# Patient Record
Sex: Female | Born: 1970 | Race: Black or African American | Hispanic: No | Marital: Single | Smoking: Never smoker
Health system: Southern US, Community
[De-identification: ages and names within clinical notes are randomized; demographics above are authoritative.]

## PROBLEM LIST (undated history)

## (undated) DIAGNOSIS — E119 Type 2 diabetes mellitus without complications: Secondary | ICD-10-CM

## (undated) DIAGNOSIS — E785 Hyperlipidemia, unspecified: Secondary | ICD-10-CM

## (undated) HISTORY — PX: CERVICAL CERCLAGE: SHX1329

## (undated) HISTORY — DX: Type 2 diabetes mellitus without complications: E11.9

## (undated) HISTORY — PX: OTHER SURGICAL HISTORY: SHX169

## (undated) HISTORY — PX: TUBAL LIGATION: SHX77

## (undated) HISTORY — PX: CHOLECYSTECTOMY: SHX55

## (undated) HISTORY — DX: Hyperlipidemia, unspecified: E78.5

---

## 1998-10-29 ENCOUNTER — Other Ambulatory Visit: Admission: RE | Admit: 1998-10-29 | Discharge: 1998-10-29 | Payer: Self-pay | Admitting: Obstetrics and Gynecology

## 2000-01-07 ENCOUNTER — Other Ambulatory Visit: Admission: RE | Admit: 2000-01-07 | Discharge: 2000-01-07 | Payer: Self-pay | Admitting: Obstetrics and Gynecology

## 2000-10-30 ENCOUNTER — Encounter: Payer: Self-pay | Admitting: Obstetrics and Gynecology

## 2000-10-30 ENCOUNTER — Encounter: Admission: RE | Admit: 2000-10-30 | Discharge: 2000-10-30 | Payer: Self-pay | Admitting: Obstetrics and Gynecology

## 2001-05-06 ENCOUNTER — Encounter: Admission: RE | Admit: 2001-05-06 | Discharge: 2001-08-04 | Payer: Self-pay | Admitting: Family Medicine

## 2001-06-08 ENCOUNTER — Ambulatory Visit (HOSPITAL_COMMUNITY): Admission: RE | Admit: 2001-06-08 | Discharge: 2001-06-08 | Payer: Self-pay | Admitting: Obstetrics and Gynecology

## 2001-06-08 ENCOUNTER — Encounter: Payer: Self-pay | Admitting: Obstetrics and Gynecology

## 2001-09-02 ENCOUNTER — Other Ambulatory Visit: Admission: RE | Admit: 2001-09-02 | Discharge: 2001-09-02 | Payer: Self-pay | Admitting: *Deleted

## 2001-10-05 ENCOUNTER — Ambulatory Visit (HOSPITAL_COMMUNITY): Admission: RE | Admit: 2001-10-05 | Discharge: 2001-10-05 | Payer: Self-pay | Admitting: *Deleted

## 2002-05-25 ENCOUNTER — Emergency Department (HOSPITAL_COMMUNITY): Admission: EM | Admit: 2002-05-25 | Discharge: 2002-05-25 | Payer: Self-pay | Admitting: Emergency Medicine

## 2002-05-25 ENCOUNTER — Encounter: Payer: Self-pay | Admitting: Emergency Medicine

## 2002-05-25 ENCOUNTER — Ambulatory Visit (HOSPITAL_COMMUNITY): Admission: RE | Admit: 2002-05-25 | Discharge: 2002-05-25 | Payer: Self-pay | Admitting: Emergency Medicine

## 2002-07-05 ENCOUNTER — Other Ambulatory Visit: Admission: RE | Admit: 2002-07-05 | Discharge: 2002-07-05 | Payer: Self-pay | Admitting: Obstetrics and Gynecology

## 2002-10-04 ENCOUNTER — Inpatient Hospital Stay (HOSPITAL_COMMUNITY): Admission: AD | Admit: 2002-10-04 | Discharge: 2002-10-14 | Payer: Self-pay | Admitting: Obstetrics and Gynecology

## 2002-10-04 ENCOUNTER — Encounter: Payer: Self-pay | Admitting: Obstetrics and Gynecology

## 2002-10-07 ENCOUNTER — Encounter: Payer: Self-pay | Admitting: Obstetrics and Gynecology

## 2002-10-12 ENCOUNTER — Encounter: Payer: Self-pay | Admitting: Obstetrics and Gynecology

## 2002-10-15 ENCOUNTER — Inpatient Hospital Stay (HOSPITAL_COMMUNITY): Admission: AD | Admit: 2002-10-15 | Discharge: 2002-10-17 | Payer: Self-pay | Admitting: Obstetrics and Gynecology

## 2002-10-15 ENCOUNTER — Encounter: Payer: Self-pay | Admitting: *Deleted

## 2002-11-15 ENCOUNTER — Encounter: Payer: Self-pay | Admitting: *Deleted

## 2002-11-15 ENCOUNTER — Inpatient Hospital Stay (HOSPITAL_COMMUNITY): Admission: RE | Admit: 2002-11-15 | Discharge: 2002-11-15 | Payer: Self-pay | Admitting: *Deleted

## 2002-11-23 ENCOUNTER — Inpatient Hospital Stay (HOSPITAL_COMMUNITY): Admission: AD | Admit: 2002-11-23 | Discharge: 2002-12-06 | Payer: Self-pay | Admitting: *Deleted

## 2002-11-27 ENCOUNTER — Encounter: Payer: Self-pay | Admitting: Obstetrics and Gynecology

## 2002-12-04 ENCOUNTER — Encounter (INDEPENDENT_AMBULATORY_CARE_PROVIDER_SITE_OTHER): Payer: Self-pay | Admitting: *Deleted

## 2002-12-07 ENCOUNTER — Encounter: Admission: RE | Admit: 2002-12-07 | Discharge: 2003-01-06 | Payer: Self-pay | Admitting: *Deleted

## 2003-01-10 ENCOUNTER — Encounter: Admission: RE | Admit: 2003-01-10 | Discharge: 2003-01-10 | Payer: Self-pay | Admitting: *Deleted

## 2004-01-25 ENCOUNTER — Other Ambulatory Visit: Admission: RE | Admit: 2004-01-25 | Discharge: 2004-01-25 | Payer: Self-pay | Admitting: *Deleted

## 2004-08-29 ENCOUNTER — Encounter: Admission: RE | Admit: 2004-08-29 | Discharge: 2004-09-17 | Payer: Self-pay | Admitting: Obstetrics & Gynecology

## 2004-09-17 ENCOUNTER — Ambulatory Visit: Payer: Self-pay | Admitting: *Deleted

## 2004-09-24 ENCOUNTER — Ambulatory Visit: Payer: Self-pay | Admitting: *Deleted

## 2004-09-24 ENCOUNTER — Observation Stay (HOSPITAL_COMMUNITY): Admission: RE | Admit: 2004-09-24 | Discharge: 2004-09-25 | Payer: Self-pay | Admitting: *Deleted

## 2004-10-02 ENCOUNTER — Ambulatory Visit: Payer: Self-pay | Admitting: *Deleted

## 2004-10-09 ENCOUNTER — Ambulatory Visit: Payer: Self-pay | Admitting: *Deleted

## 2004-10-16 ENCOUNTER — Ambulatory Visit: Payer: Self-pay | Admitting: *Deleted

## 2004-10-16 ENCOUNTER — Ambulatory Visit (HOSPITAL_COMMUNITY): Admission: RE | Admit: 2004-10-16 | Discharge: 2004-10-16 | Payer: Self-pay | Admitting: *Deleted

## 2004-10-23 ENCOUNTER — Ambulatory Visit: Payer: Self-pay | Admitting: Family Medicine

## 2004-10-25 ENCOUNTER — Ambulatory Visit: Payer: Self-pay | Admitting: Obstetrics & Gynecology

## 2004-10-31 ENCOUNTER — Ambulatory Visit: Payer: Self-pay | Admitting: Family Medicine

## 2004-11-06 ENCOUNTER — Ambulatory Visit: Payer: Self-pay | Admitting: *Deleted

## 2004-11-12 ENCOUNTER — Ambulatory Visit (HOSPITAL_COMMUNITY): Admission: RE | Admit: 2004-11-12 | Discharge: 2004-11-12 | Payer: Self-pay | Admitting: *Deleted

## 2004-11-13 ENCOUNTER — Ambulatory Visit: Payer: Self-pay | Admitting: *Deleted

## 2004-11-20 ENCOUNTER — Ambulatory Visit: Payer: Self-pay | Admitting: *Deleted

## 2004-11-24 ENCOUNTER — Inpatient Hospital Stay (HOSPITAL_COMMUNITY): Admission: AD | Admit: 2004-11-24 | Discharge: 2004-11-29 | Payer: Self-pay | Admitting: Obstetrics & Gynecology

## 2004-11-26 ENCOUNTER — Ambulatory Visit: Payer: Self-pay | Admitting: *Deleted

## 2004-12-04 ENCOUNTER — Ambulatory Visit: Payer: Self-pay | Admitting: *Deleted

## 2004-12-04 ENCOUNTER — Ambulatory Visit (HOSPITAL_COMMUNITY): Admission: RE | Admit: 2004-12-04 | Discharge: 2004-12-04 | Payer: Self-pay | Admitting: *Deleted

## 2004-12-10 ENCOUNTER — Inpatient Hospital Stay (HOSPITAL_COMMUNITY): Admission: AD | Admit: 2004-12-10 | Discharge: 2004-12-10 | Payer: Self-pay | Admitting: *Deleted

## 2004-12-18 ENCOUNTER — Ambulatory Visit: Payer: Self-pay | Admitting: *Deleted

## 2004-12-25 ENCOUNTER — Ambulatory Visit: Payer: Self-pay | Admitting: *Deleted

## 2004-12-25 ENCOUNTER — Ambulatory Visit (HOSPITAL_COMMUNITY): Admission: RE | Admit: 2004-12-25 | Discharge: 2004-12-25 | Payer: Self-pay | Admitting: *Deleted

## 2005-01-01 ENCOUNTER — Ambulatory Visit: Payer: Self-pay | Admitting: *Deleted

## 2005-01-09 ENCOUNTER — Ambulatory Visit: Payer: Self-pay | Admitting: Family Medicine

## 2005-01-15 ENCOUNTER — Ambulatory Visit: Payer: Self-pay | Admitting: *Deleted

## 2005-01-22 ENCOUNTER — Ambulatory Visit (HOSPITAL_COMMUNITY): Admission: RE | Admit: 2005-01-22 | Discharge: 2005-01-22 | Payer: Self-pay | Admitting: *Deleted

## 2005-01-22 ENCOUNTER — Ambulatory Visit: Payer: Self-pay | Admitting: *Deleted

## 2005-01-29 ENCOUNTER — Ambulatory Visit: Payer: Self-pay | Admitting: *Deleted

## 2005-02-05 ENCOUNTER — Ambulatory Visit: Payer: Self-pay | Admitting: *Deleted

## 2005-02-10 ENCOUNTER — Ambulatory Visit: Payer: Self-pay | Admitting: *Deleted

## 2005-02-10 IMAGING — US US OB TRANSVAGINAL
1 series · 14 of 15 positions shown · non-contrast
Comparison: none

CLINICAL DATA: Question change in cervical length.  Cerclage.
 TRANSVAGINAL OBSTETRICAL ULTRASOUND:
 Transvaginal images only are performed, showing cervical length of 3.3 cm.  Cerclage is visualized.  Note is made of small amount of funneling of the internal os with internal os dilatation of 8 mm.  
 Fetus is in breech presentation and fetal movement was noted, though heart rate could not be obtained from transvaginal approach.

[Series 1: us ob transvaginal · 0.19mm/px · 14 of 15 slices shown]
[im 1/15]
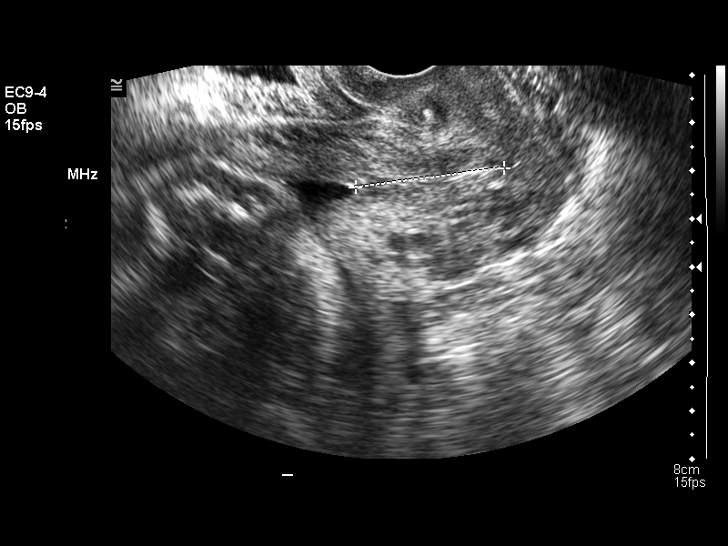
[im 2/15]
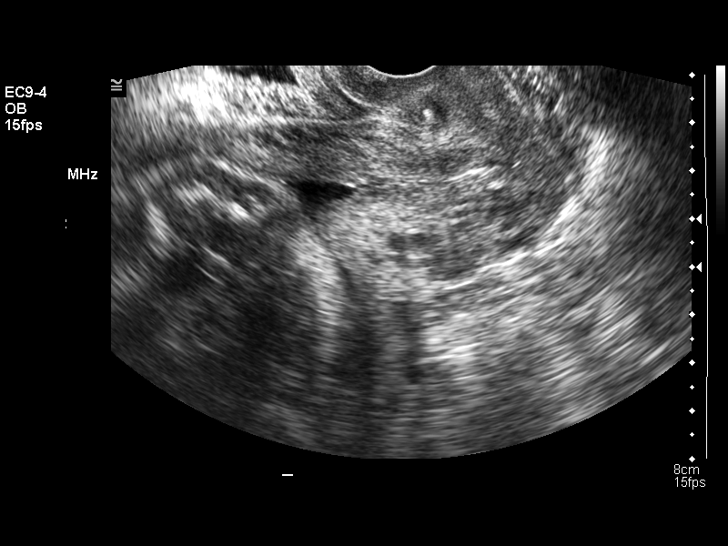
[im 3/15]
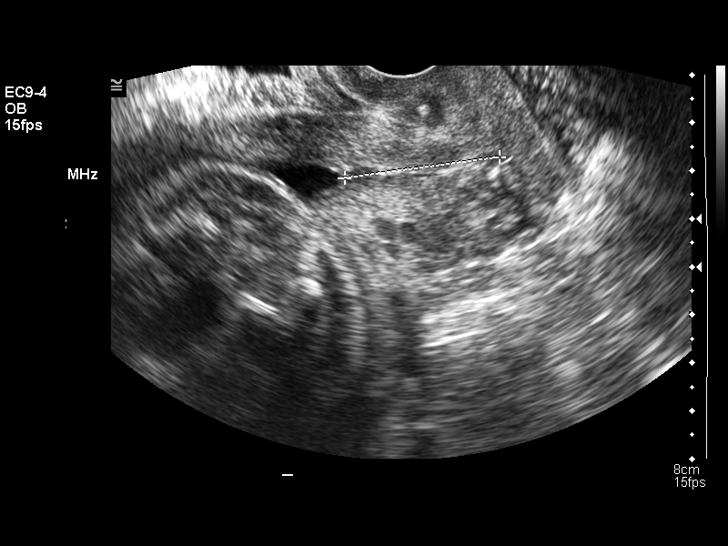
[im 4/15]
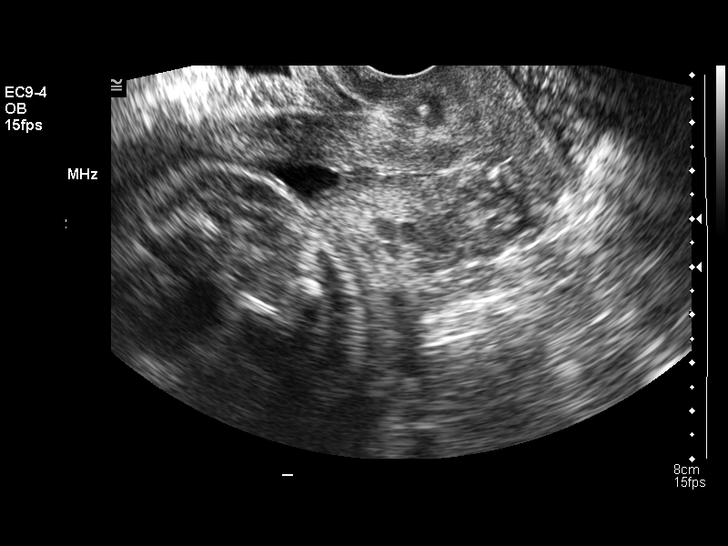
[im 5/15]
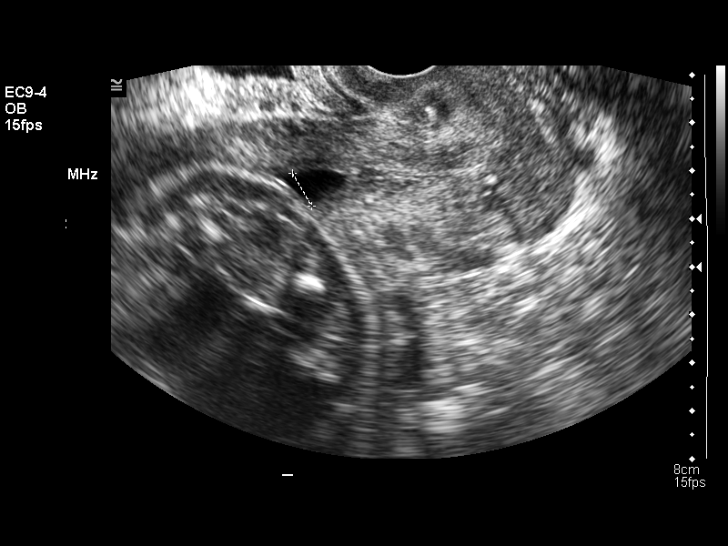
[im 6/15]
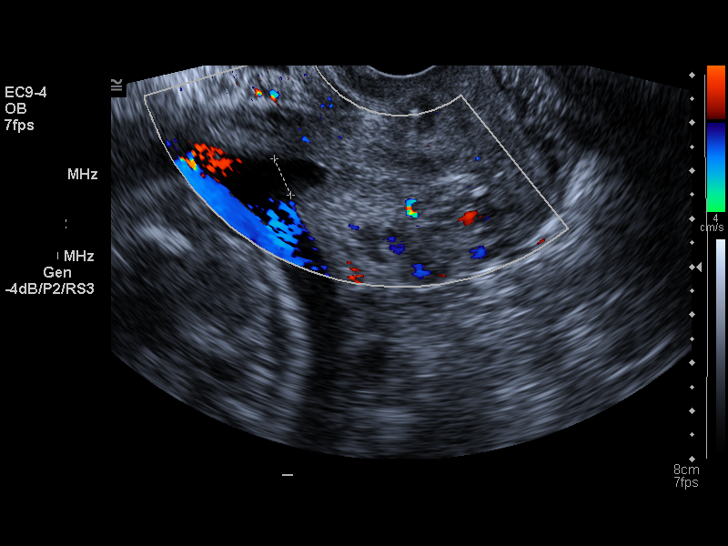
[im 7/15]
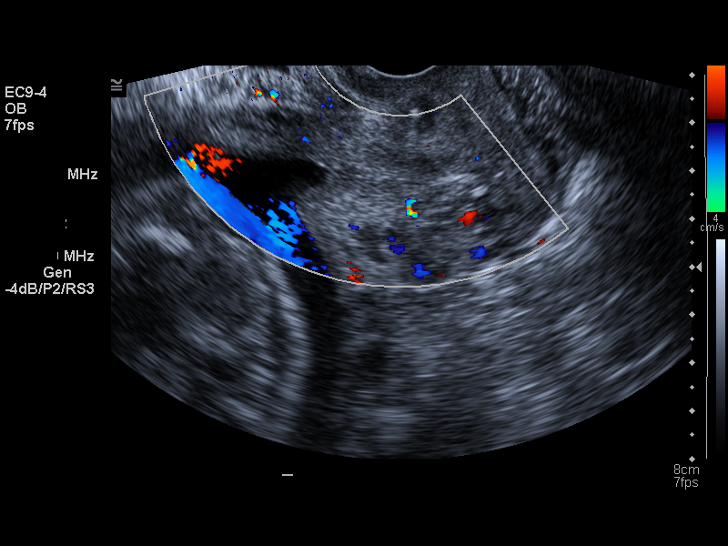
[im 9/15]
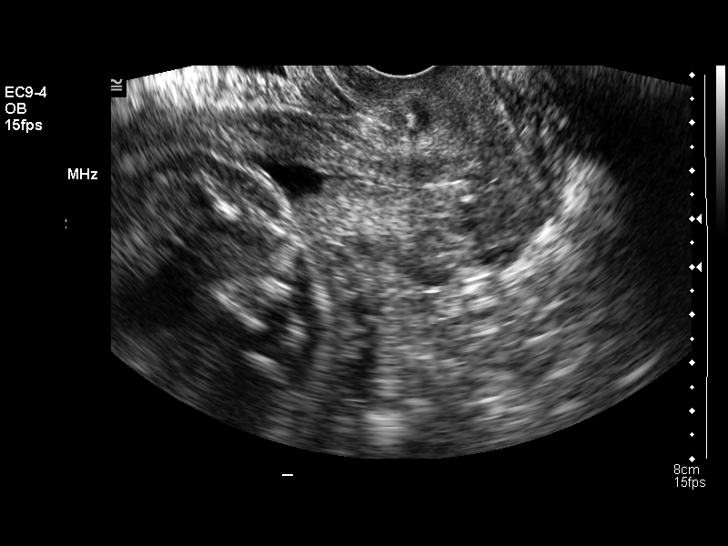
[im 10/15]
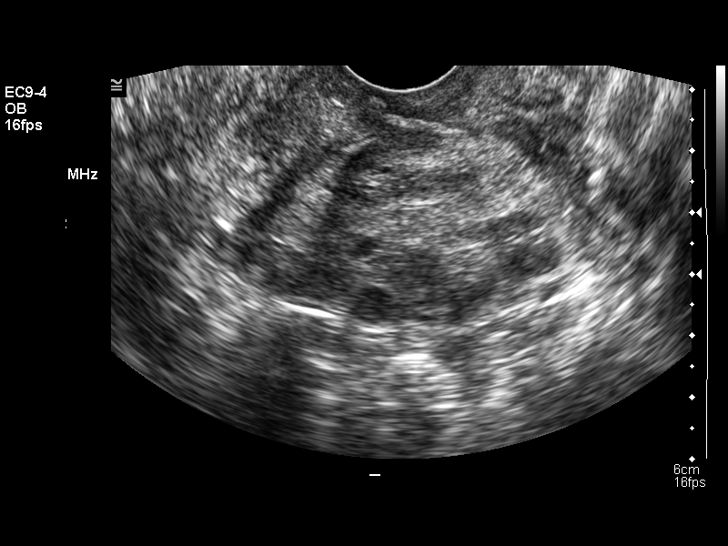
[im 11/15]
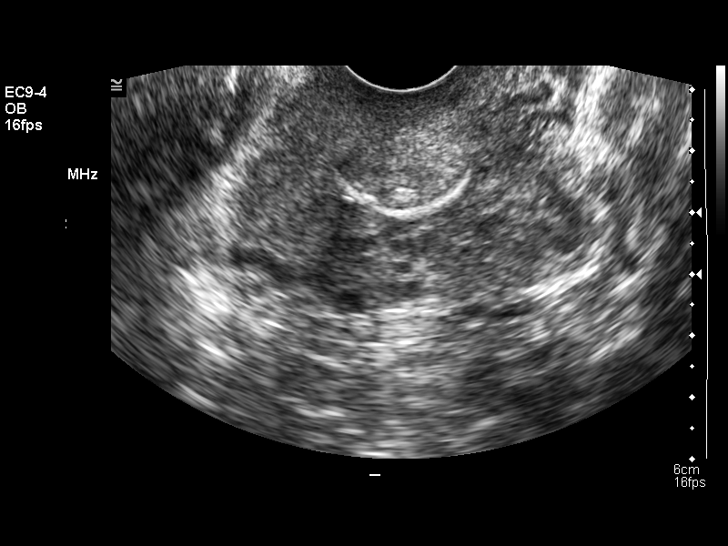
[im 12/15]
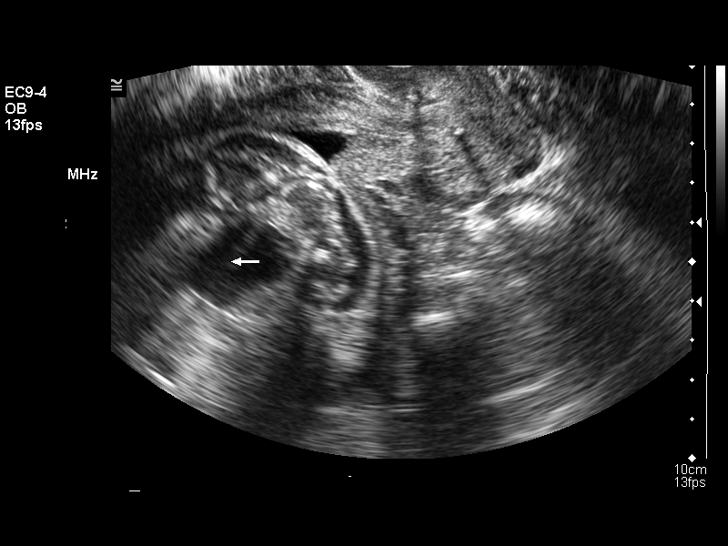
[im 13/15]
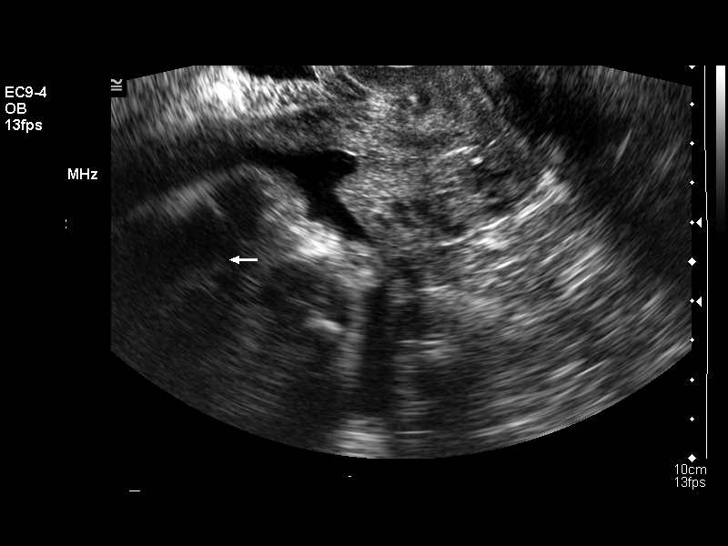
[im 14/15]
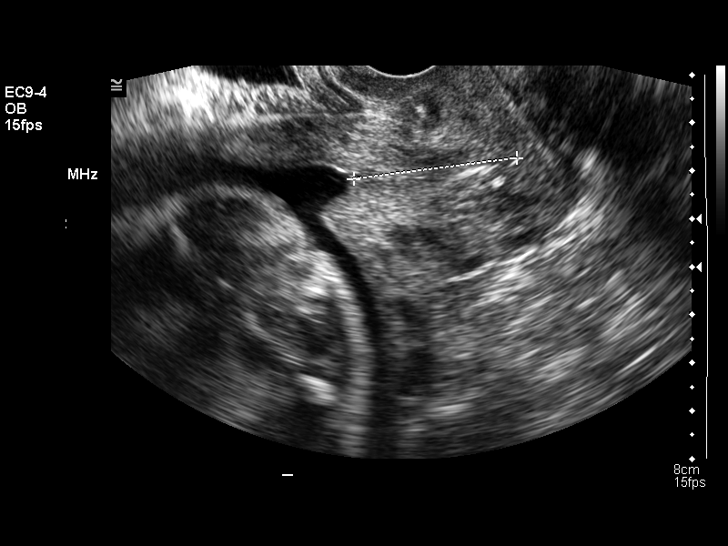
[im 15/15]
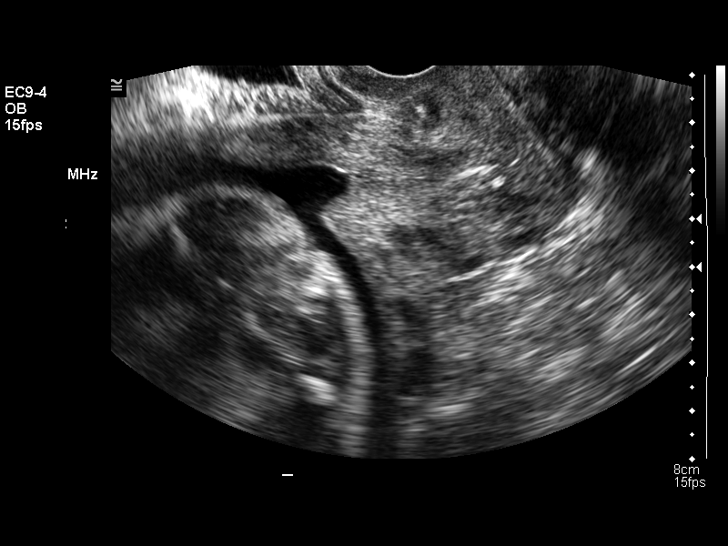

[14 of 15 positions shown; findings below may reference images not displayed]

IMPRESSION: 1.  Cervical length of 3.3 cm.
 2.  Minimal funneling of internal os.

## 2005-02-12 ENCOUNTER — Ambulatory Visit: Payer: Self-pay | Admitting: *Deleted

## 2005-02-17 ENCOUNTER — Ambulatory Visit: Payer: Self-pay | Admitting: Obstetrics & Gynecology

## 2005-02-19 ENCOUNTER — Ambulatory Visit: Payer: Self-pay | Admitting: *Deleted

## 2005-02-19 ENCOUNTER — Ambulatory Visit (HOSPITAL_COMMUNITY): Admission: RE | Admit: 2005-02-19 | Discharge: 2005-02-19 | Payer: Self-pay | Admitting: *Deleted

## 2005-02-24 ENCOUNTER — Ambulatory Visit: Payer: Self-pay | Admitting: Obstetrics & Gynecology

## 2005-02-26 ENCOUNTER — Ambulatory Visit: Payer: Self-pay | Admitting: *Deleted

## 2005-02-26 ENCOUNTER — Ambulatory Visit (HOSPITAL_COMMUNITY): Admission: RE | Admit: 2005-02-26 | Discharge: 2005-02-26 | Payer: Self-pay | Admitting: *Deleted

## 2005-02-27 ENCOUNTER — Inpatient Hospital Stay (HOSPITAL_COMMUNITY): Admission: AD | Admit: 2005-02-27 | Discharge: 2005-02-27 | Payer: Self-pay | Admitting: Obstetrics & Gynecology

## 2005-03-01 ENCOUNTER — Inpatient Hospital Stay (HOSPITAL_COMMUNITY): Admission: AD | Admit: 2005-03-01 | Discharge: 2005-03-04 | Payer: Self-pay | Admitting: *Deleted

## 2005-03-01 ENCOUNTER — Ambulatory Visit: Payer: Self-pay | Admitting: *Deleted

## 2005-03-01 ENCOUNTER — Encounter (INDEPENDENT_AMBULATORY_CARE_PROVIDER_SITE_OTHER): Payer: Self-pay | Admitting: Specialist

## 2005-03-06 ENCOUNTER — Inpatient Hospital Stay (HOSPITAL_COMMUNITY): Admission: AD | Admit: 2005-03-06 | Discharge: 2005-03-06 | Payer: Self-pay | Admitting: Obstetrics & Gynecology

## 2005-03-07 ENCOUNTER — Encounter: Payer: Self-pay | Admitting: Emergency Medicine

## 2005-03-08 ENCOUNTER — Inpatient Hospital Stay (HOSPITAL_COMMUNITY): Admission: AD | Admit: 2005-03-08 | Discharge: 2005-03-08 | Payer: Self-pay | Admitting: Obstetrics and Gynecology

## 2005-03-08 IMAGING — US US FETAL BPP W/O NONSTRESS
1 series · 13 of 28 positions shown · non-contrast
Comparison: none

CLINICAL DATA: Assess growth and fetal well-being.  Insulin dependent diabetes.

[Series 1: us fetal bpp w/o nonstress · 0.37mm/px · 36 acquisitions, 13 frames shown]
[im 2/36]
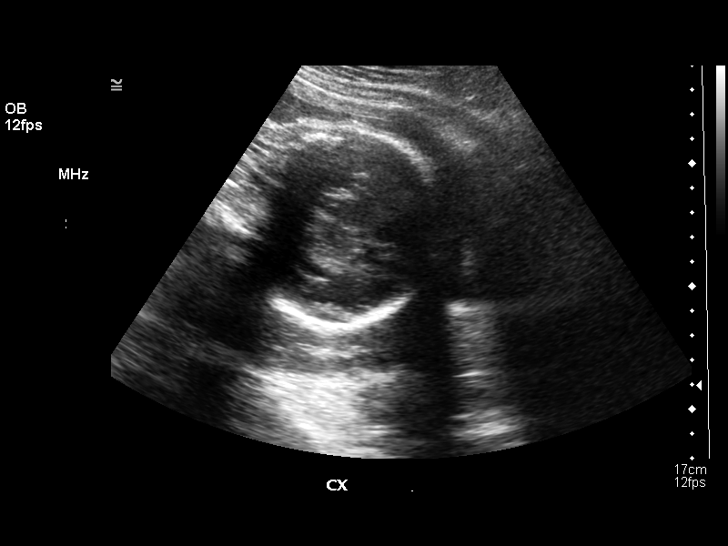
[im 4/36]
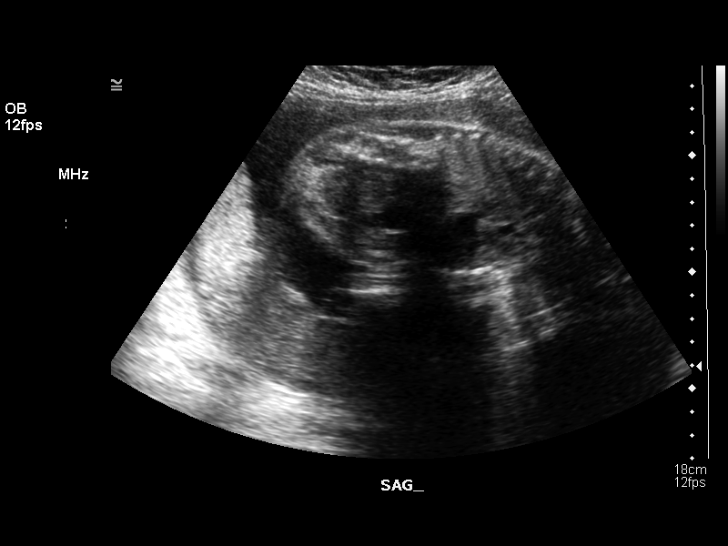
[im 7/36]
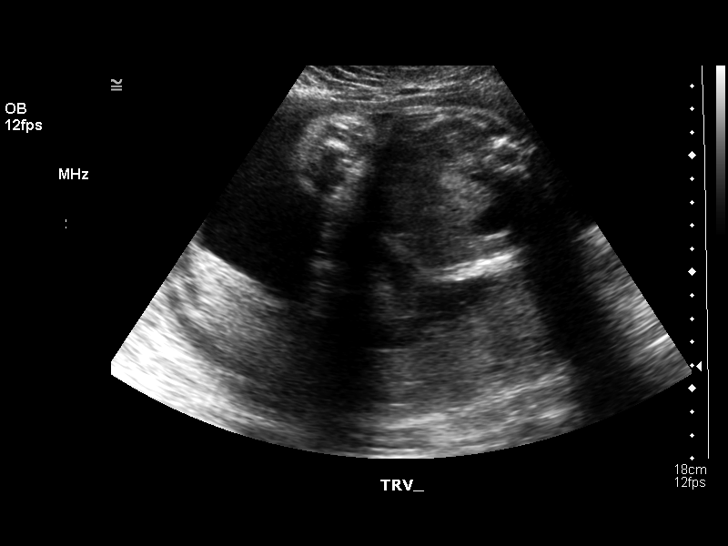
[im 10/36]
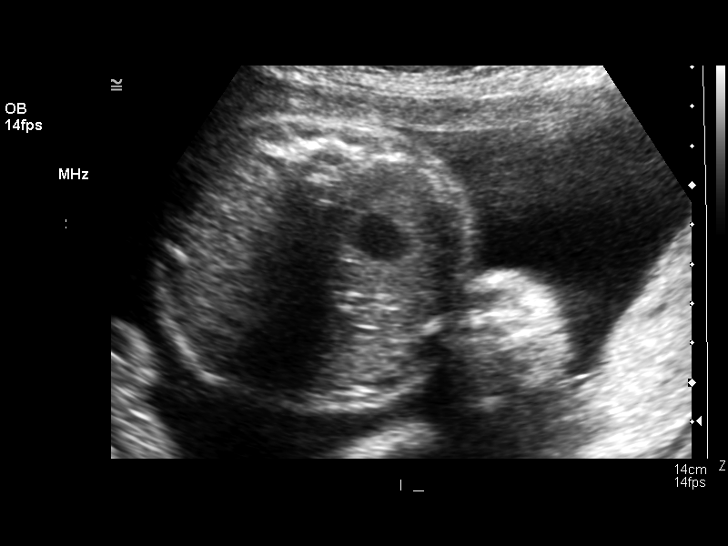
[im 12/36]
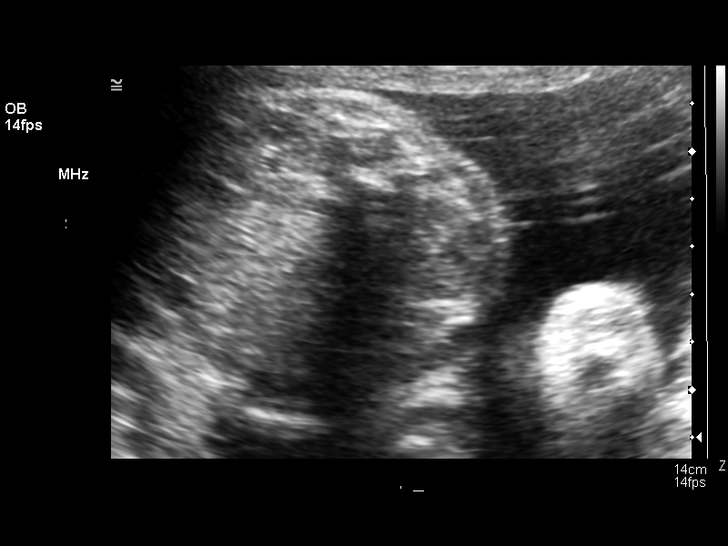
[im 15/36]
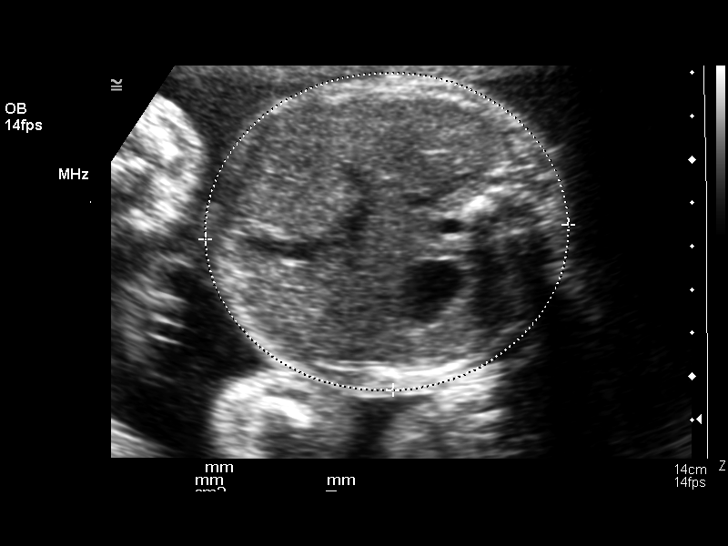
[im 19/36]
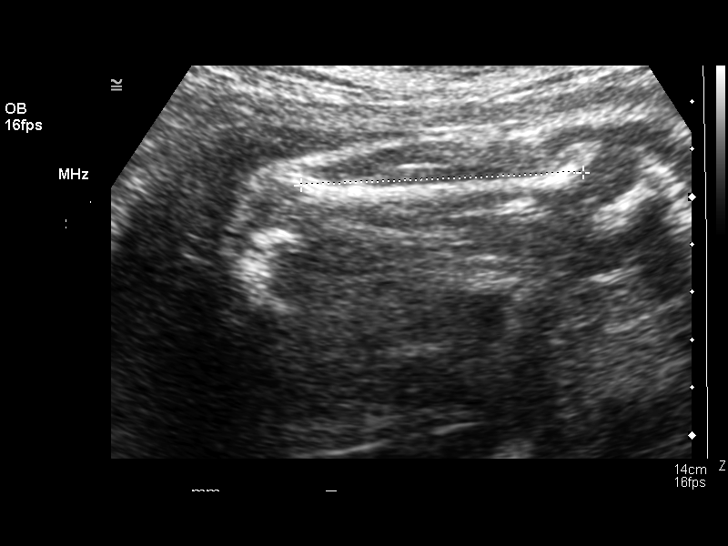
[im 21/36]
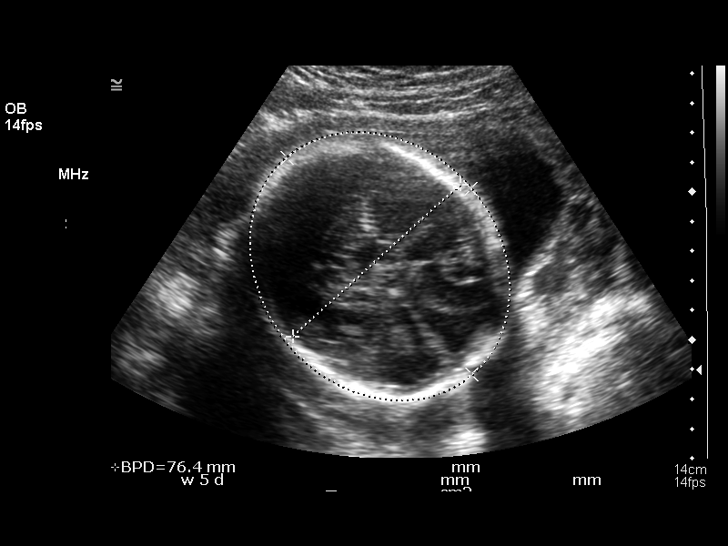
[im 24/36]
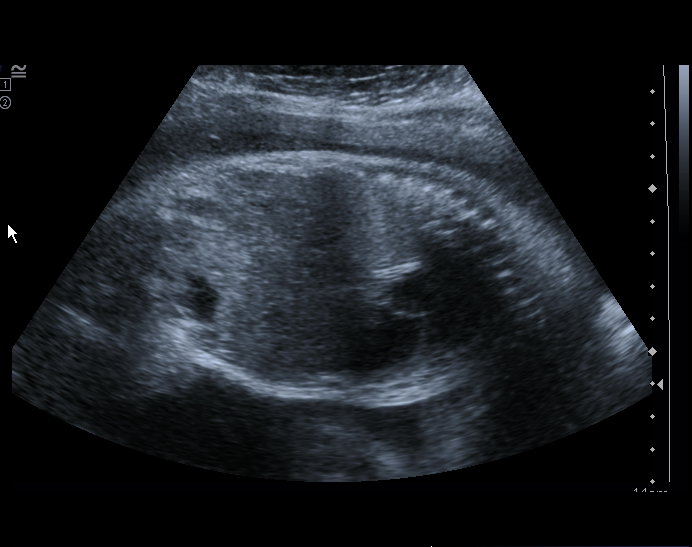
[im 26/36]
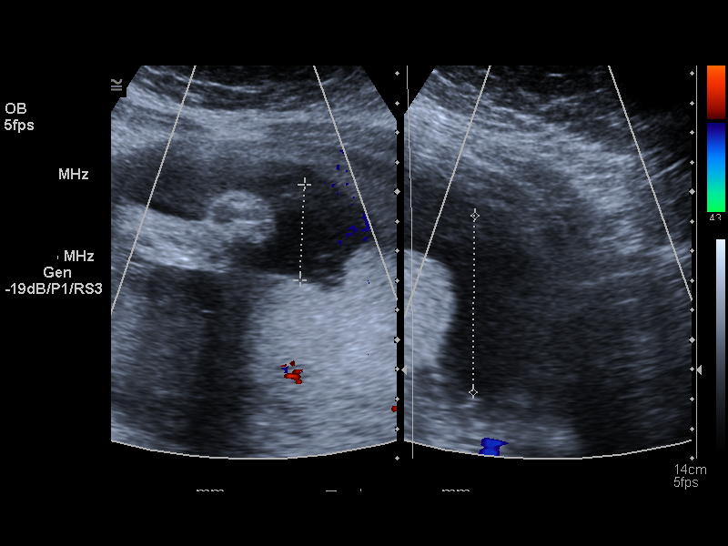
[im 29/36]
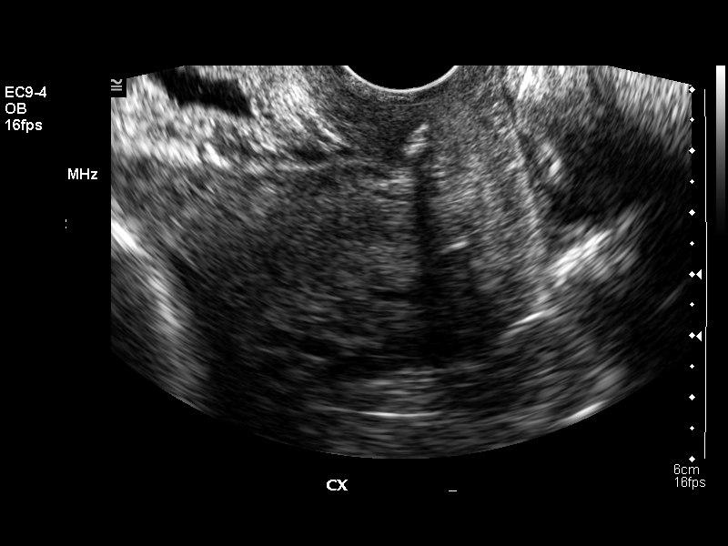
[im 32/36]
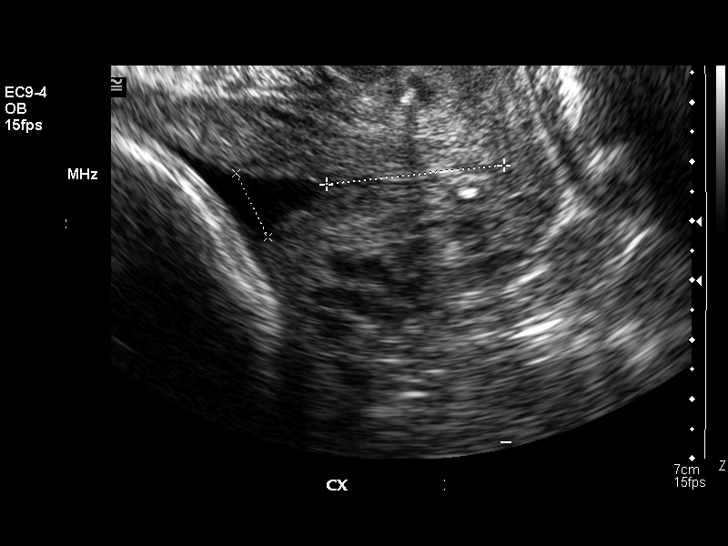
[im 34/36]
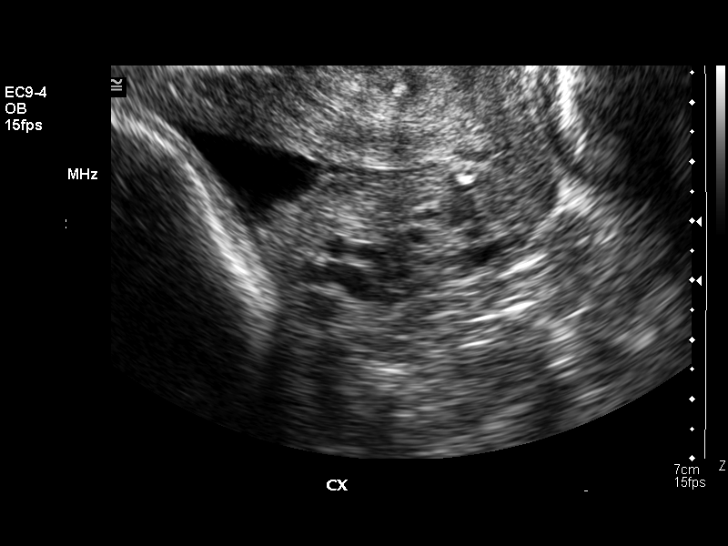

[13 of 28 positions shown; findings below may reference images not displayed]

OBSTETRICAL ULTRASOUND RE-EVALUATION WITH TRANSVAGINAL:
Number of Fetuses:  1
Heart Rate:  144
Movement:  Yes
Breathing:  Yes
Presentation:  Cephalic
Placental Location:  Posterior
Grade:  I
Previa:  No
Amniotic Fluid (subjective):  Normal
Amniotic Fluid (objective):  18.3 cm AFI (5th -95th%ile =   9.2 ? 23.1 cm for 29 wks)

FETAL BIOMETRY
BPD:  7.7 cm   30 w 5 d
HC:  27.9 cm   30 w 4 d
AC:  25.0 cm   29 w 2 d
FL:  5.9 cm   30 w 5 d

Mean GA:  30 w 2 d
Assigned GA:  28 w 6 d
Fetal indices are within normal limits. 
EFW: 6616 g (H) 75th ? 90th%ile (8975 ? 9904 g) For 29 wks

FETAL ANATOMY
Lateral Ventricles:  Previously seen 
Thalami/CSP:  Previously seen 
Posterior Fossa:  Previously seen 
Nuchal Region:  Previously seen 
Spine:  Previously seen 
4 Chamber Heart on Left:  Not visualized 
Stomach on Left:  Visualized 
3 Vessel Cord:  Previously seen 
Cord Insertion Site:  Previously seen 
Kidneys:  Visualized 
Bladder:  Visualized 
Extremities:  Previously seen 

MATERNAL UTERINE AND ADNEXAL FINDINGS
Cervix:  3.3 cm Transvaginally
There is funneling noted at the level of the internal cervical os measuring 1.2 cm.  The patient?s cerclage is visualized and appears intact.  

BIOPHYSICAL PROFILE

Movement:   2    Time:  15 minutes
Breathing:  2
Tone:  2
Amniotic Fluid:  2

Total Score:  8
IMPRESSION: 1.  Single intrauterine pregnancy demonstrating an estimated gestational age by ultrasound of 30 weeks and 2 days.  This is 1 week and 3 days ahead of assigned gestational age by initial ultrasound of 28 weeks and 6 days.  Currently the estimated fetal weight is just below the 90th percentile for a 29 week gestation.  Close follow-up for growth is recommended.  
2.  Subjectively and quantitatively normal amniotic fluid volume and normal cervical length.  Noted is minimal funneling at the level of the internal cervical os.  
3.  Biophysical profile score [DATE]. 
4.  No late developing fetal anatomic abnormalities are identified associated with the stomach, kidneys or bladder.  The lateral ventricles and four chamber heart view could not be seen with confidence due to positioning on today?s exam.

## 2005-04-17 ENCOUNTER — Ambulatory Visit: Payer: Self-pay | Admitting: Obstetrics and Gynecology

## 2005-05-19 IMAGING — CR DG CHEST 2V
2 series · 2 of 2 positions shown · non-contrast
Comparison: none

CLINICAL DATA: chest pain   and shortness of breath     
COMPARISION: NONE  31IGP-3 VIEWS:

[w chest pa]
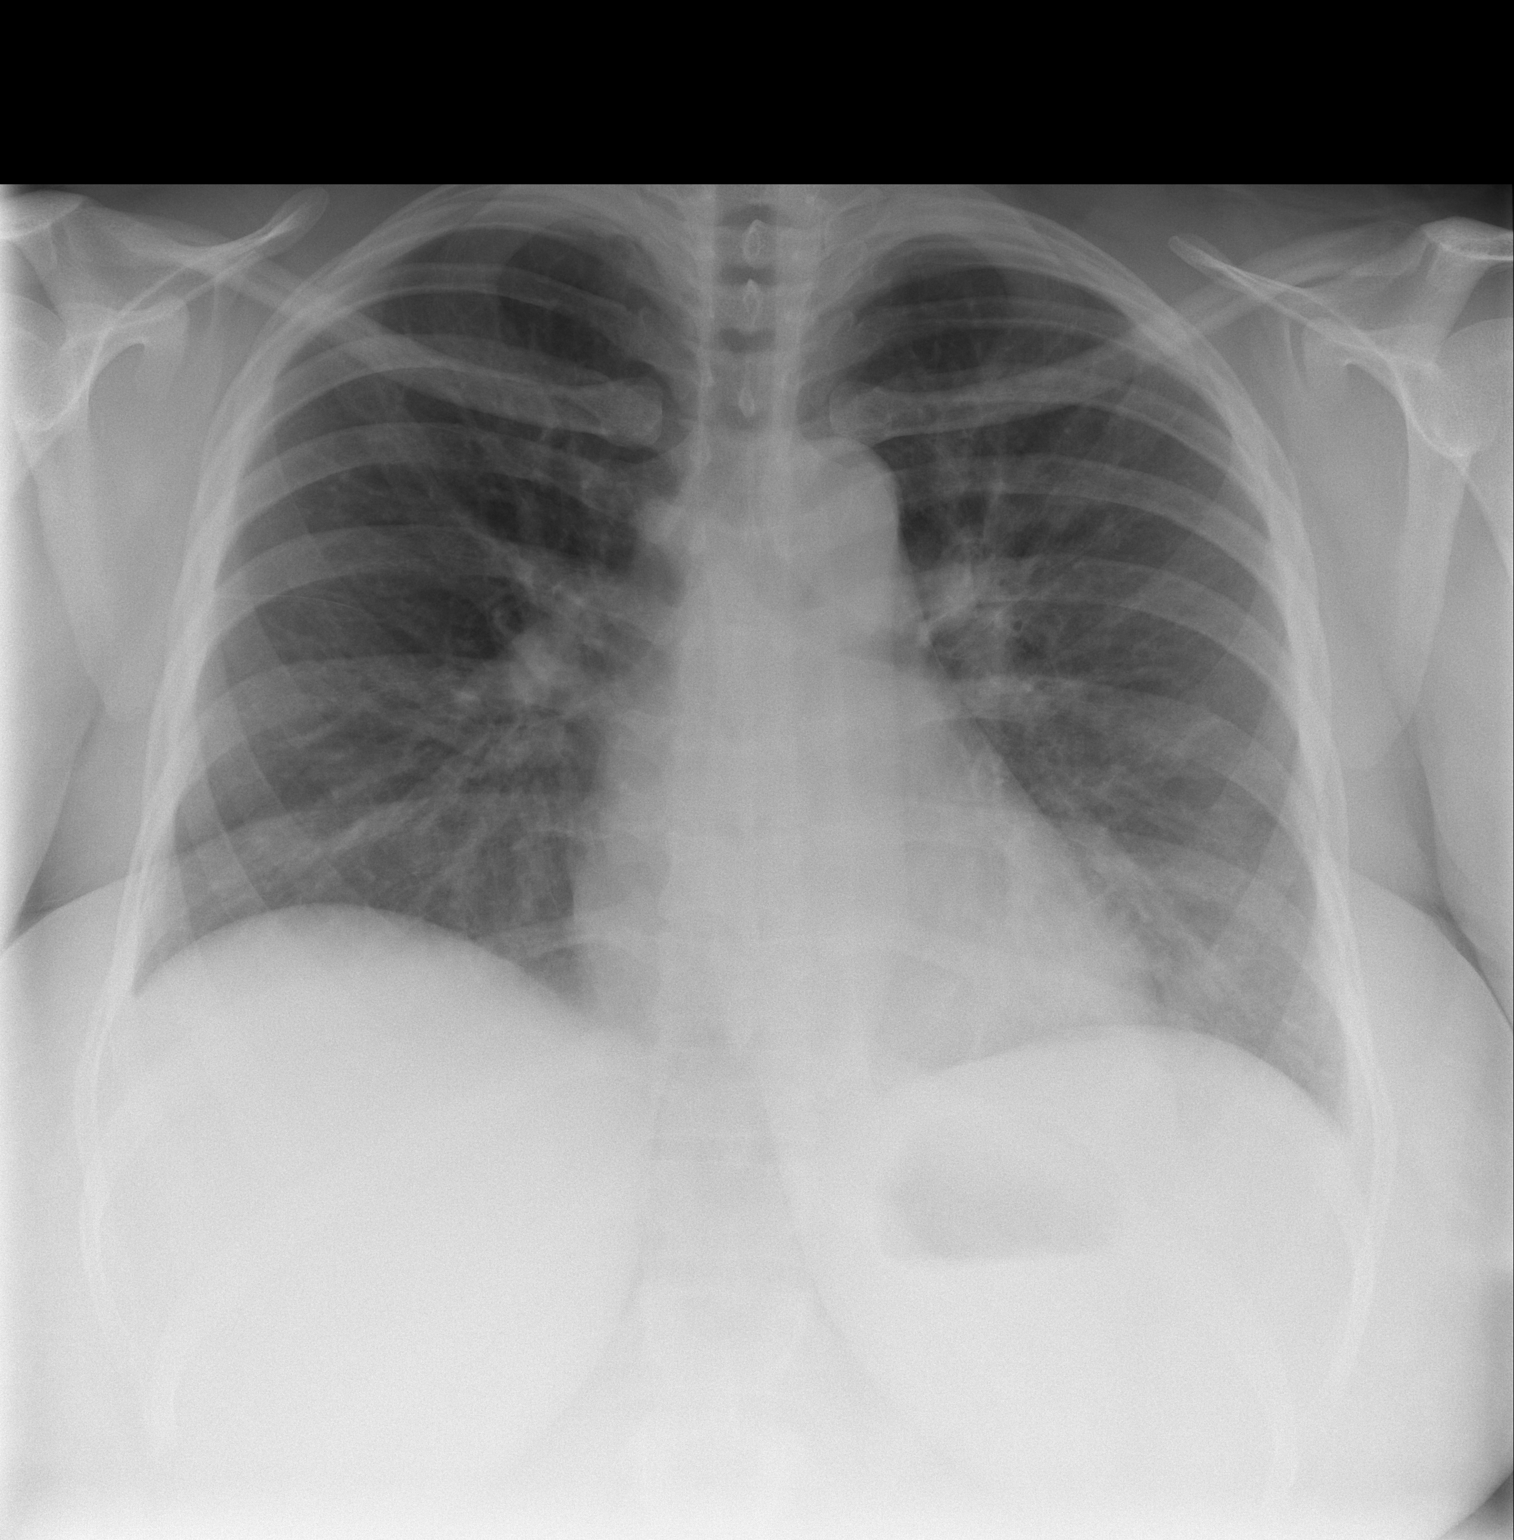

[w chest lat]
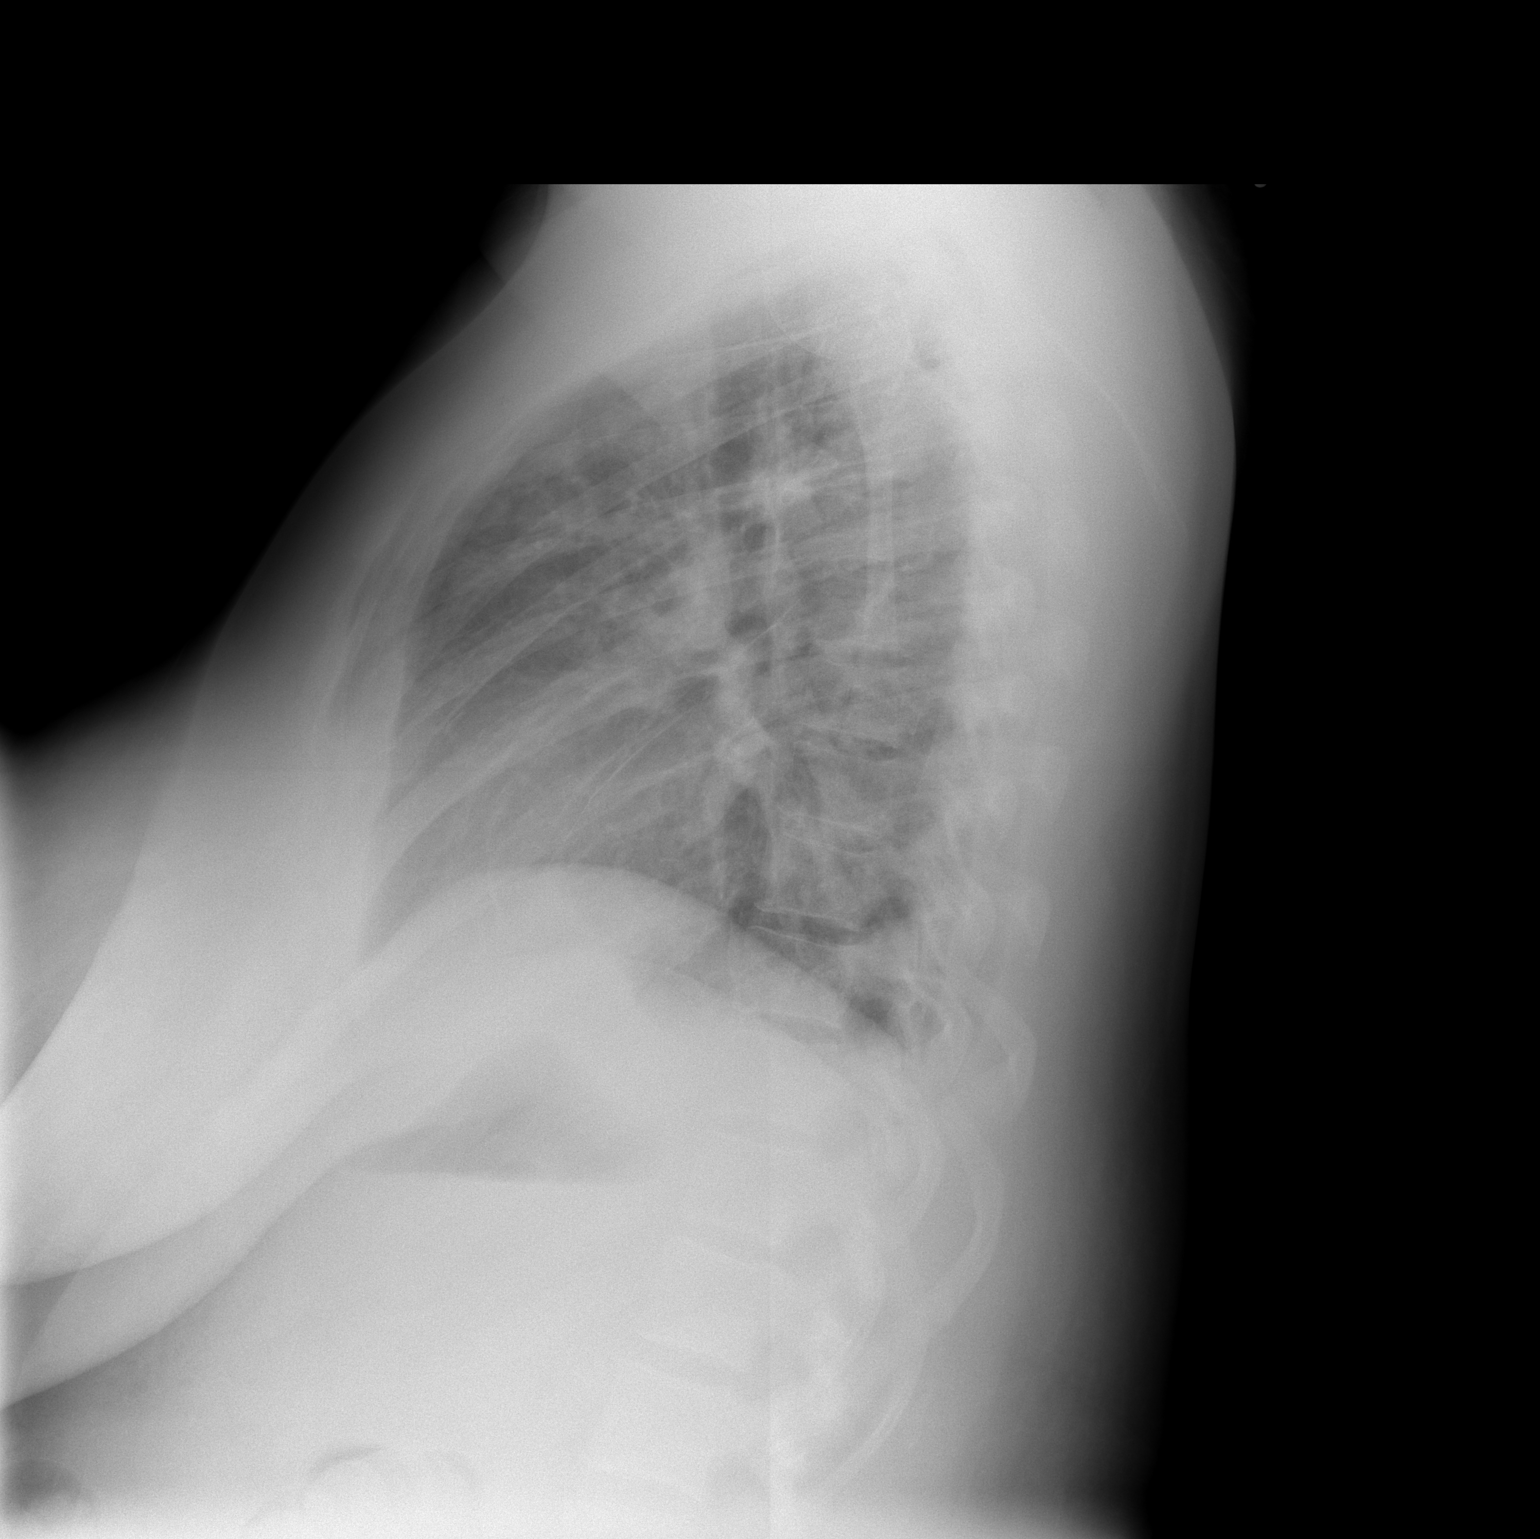

[2 of 2 positions shown; findings below may reference images not displayed]

FINDINGS: No focal lung opacities.  Heart size is normal.  No pleural effusions or pneumothorax.  Mild pulmonary venous redistribution is noted which may represent early pulmonary edema.
IMPRESSION: 1.  No pneumonia.
2.  Mild pulmonary redistribution which may indicate early pulmonary edema.

## 2005-06-29 ENCOUNTER — Emergency Department (HOSPITAL_COMMUNITY): Admission: EM | Admit: 2005-06-29 | Discharge: 2005-06-29 | Payer: Self-pay | Admitting: Emergency Medicine

## 2005-07-03 ENCOUNTER — Ambulatory Visit: Payer: Self-pay | Admitting: Gastroenterology

## 2005-07-10 ENCOUNTER — Ambulatory Visit: Payer: Self-pay | Admitting: Gastroenterology

## 2005-07-31 ENCOUNTER — Ambulatory Visit: Payer: Self-pay | Admitting: Gastroenterology

## 2005-11-11 ENCOUNTER — Encounter: Admission: RE | Admit: 2005-11-11 | Discharge: 2006-02-09 | Payer: Self-pay | Admitting: Counselor

## 2006-07-03 ENCOUNTER — Other Ambulatory Visit: Admission: RE | Admit: 2006-07-03 | Discharge: 2006-07-03 | Payer: Self-pay | Admitting: Obstetrics and Gynecology

## 2010-05-21 ENCOUNTER — Other Ambulatory Visit: Admission: RE | Admit: 2010-05-21 | Discharge: 2010-05-21 | Payer: Self-pay | Admitting: Obstetrics and Gynecology

## 2010-05-23 ENCOUNTER — Encounter: Admission: RE | Admit: 2010-05-23 | Discharge: 2010-05-23 | Payer: Self-pay | Admitting: Obstetrics and Gynecology

## 2010-11-17 ENCOUNTER — Encounter: Payer: Self-pay | Admitting: *Deleted

## 2010-11-17 ENCOUNTER — Encounter: Payer: Self-pay | Admitting: Family Medicine

## 2011-03-14 NOTE — Op Note (Signed)
NAME:  Cynthia [name], Cynthia [name]            ACCOUNT NO.:  [id number]   MEDICAL RECORD NO.:  [id number]          PATIENT TYPE:  AMB   LOCATION:  SDC                           [facility]   PHYSICIAN:  Conni Elliot, M.D.DATE OF BIRTH:  Oct 27, 1971   DATE OF PROCEDURE:  09/24/2004  DATE OF DISCHARGE:                                 OPERATIVE REPORT   PREOPERATIVE DIAGNOSIS:  Intrauterine pregnancy at 15-16 weeks by ultrasound  with a weak cervix.   POSTOPERATIVE DIAGNOSIS:  Intrauterine pregnancy at 15-16 weeks by  ultrasound with a weak cervix.   OPERATION:  McDonald Cerclage.   OPERATOR:  Conni Elliot, M.D.   ANESTHESIA:  Spinal.   OPERATIVE FINDINGS:  There was  poorly-delineated definition of the cervix  with the vaginal portio, and the cervix appeared to be short based on that.   PROCEDURE:  After placing the patient under spinal anesthetic, the patient  was supine in the dorsal lithotomy position.  The perineum and vagina were  prepped with Betadine solution and Clindamycin douche.  A Foley catheter was  placed to straight drainage.  A weighted speculum was placed in the  posterior vagina.  The anterior cervix was grasped with a sponge stick and a  cervical cerclage was placed using #4 silk double-stranded starting at 12  o'clock.  This was followed through counterclockwise and tied at 12 o'clock.  Estimated blood loss less than 10 mL.  Instrument, needle, and sponge count  was correct.      ASG/MEDQ  D:  09/24/2004  T:  09/24/2004  Job:  [id number]

## 2011-03-14 NOTE — Discharge Summary (Signed)
Cynthia [name], Cynthia [name]            ACCOUNT NO.:  [id number]   MEDICAL RECORD NO.:  [id number]          PATIENT TYPE:  INP   LOCATION:  9131                          [facility]   PHYSICIAN:  Lesly Dukes, M.D. DATE OF BIRTH:  10-Jun-1971   DATE OF ADMISSION:  11/24/2004  DATE OF DISCHARGE:  11/29/2004                                 DISCHARGE SUMMARY   ADMISSION DIAGNOSES:  81.  A 40 year old gravida 3 para 0-1-1-1 at 24 weeks 2 days with preterm      uterine contractions.  2.  Fetal heart tones reassuring.   DISCHARGE DIAGNOSES:  54.  A 40 year old African-American female gravida 3 para 0-1-1-1 at 24 weeks      6 days with preterm uterine contractions and status post cerclage      (November 2005).  2.  Gestational diabetes.   DISCHARGE MEDICATIONS:  1.  Glyburide 5 mg p.o. b.i.d.  2.  Prenatal vitamins.   ADMISSION HISTORY:  A 40 year old G3 P0-1-1-1 at 24 weeks 2 days with a  history of cerclage on November 2005 came to [facility] complaining of finding  blood in wipe after micturition. Uterine contractions were negative. Vaginal  bleeding:  Spotting, fetal activity was positive, no LOF. On digital exam,  the cerclage was in place, the cervix was closed. Physical examination was  normal, vital signs were stable. FHT was reassuring. This patient has a  history of previous preterm delivery (NSVD with fetal death in 03-14-94 at 21  weeks and NSVD at 21 weeks status post cerclage). This patient has also  history gestational diabetes in this current pregnancy, and GBS bacteruria.  A cerclage was done on November 2005. The patient has allergies to  PENICILLIN, AMOXICILLIN, and AMPICILLIN. Current medications:  Prenatal  vitamins, glyburide 5 mg p.o. b.i.d. The patient was admitted for preterm  uterine contractions. Bedrest, glyburide, prenatal vitamins, and Ancef for  GBS positive status were indicated.   HOSPITAL COURSE:  #1 - PRETERM UTERINE CONTRACTIONS. Throughout the days of  hospitalization the patient was stable. The patient completed a 4-day course  of Ancef and a 2-day course of Keflex 500 mg p.o. daily for treatment of GBS  bacteruria. Digital cervical exam:  On day of admission, cervix was closed;  on day of discharge the cervix was fingertip to 1 cm. Cerclage was in place.  The patient was stable, FHT was reassuring on day of discharge. The patient  was discharged home with indications of bedrest, and no sexual activity (no  sex or do anything that might make the patient have an orgasm). The patient  has a follow-up appointment at the high risk clinic on December 04, 2004.   #2 - GESTATIONAL DIABETES.  Throughout the days of hospitalization CBGs were  stable. The patient was on glyburide 5 mg p.o. b.i.d.   The patient's blood type is AB, Rh positive, GBS positive, rubella immune.   CONDITION AT DISCHARGE:  Stable.   INSTRUCTIONS:  Bedrest, no sexual activity (no sex or do anything that may  make the patient have an orgasm). Return to [facility] if menstrual-  like cramps, uterine contractions, low-back ache unrelieved by heat or  Tylenol, intestinal cramps, pelvic pressure, increase or change in vaginal  discharge, vaginal bleeding, leaking of fluids, fetal movement negative.  Follow-up appointment at high risk clinic at [facility] on December 04, 2004. Medication regimen discussed with the patient. The patient understands  follow-up appointment and instructions.      FIM/MEDQ  D:  01/02/2005  T:  01/02/2005  Job:  [id number]

## 2011-03-14 NOTE — Discharge Summary (Signed)
NAME:  Cynthia [name], CYR NO.:  [id number]   MEDICAL RECORD NO.:  [id number]          PATIENT TYPE:  WOC   LOCATION:  WOC                          FACILITY:  [facility]   PHYSICIAN:  Conni Elliot, M.D.DATE OF BIRTH:  19-Nov-1970   DATE OF ADMISSION:  02/26/2005  DATE OF DISCHARGE:                                 DISCHARGE SUMMARY   DISCHARGE DIAGNOSES:  1.  Status post low transverse cesarean section secondary to late decels.  2.  Asymptomatic anemia, last hemoglobin of 7.3.  3.  Gestational diabetes, prediabetic.  4.  Group B strep positive resistance.   HOSPITAL COURSE:  This is a 40 year old African-American female whose G3, P1-  1-1-2 status post LTCS at 31 1/7 weeks secondary to fetal tachycardia and  mild late decels who has a history of cervical incompetence, second  trimester fetal demise. She has cerclage this pregnancy and made it to full  term. She came in with spontaneous labor and during her monitoring baby's  baseline heart rate changed to the 160 range and was doing well. Her mom has  a history of  group B strep that is resistant to standard antibiotic  therapy. The patient was taken to C section when fetal heart monitoring  showed some mild late decels. Mom did spike a temperature after her C  section. She was treated with antibiotics. She has PENICILLIN allergies so  she was treated with Cefotan 2 g IV every 12 hours adequately prior to  delivery.   1.  The patient did well postoperatively; however, she did have anemia with      a postop H&H of 7.3 and 21.7. The patient was asymptomatic of this and      was discharged on iron sulfate replacement and Colace.  2.  History of gestational diabetes. The patient's sugars actually were      running well postoperatively. She did have a high of 166, two hours post      prandial one day, her fastings were around 108. She was on glyburide      prior to her delivery. The patient requests not to be on oral  medications at discharge or insulin. She wants to try diet and exercise      first and she will followup with her primary care physician for further      management to see if she is indeed a type 2 diabetic.  3.  The patient is breast feeding.  4.  The patient will return to triage in two days for staple removal. She      has been instructed on how to take care of her wound due to her large      __________. Keep the area dry and clean.   DISCHARGE MEDICATIONS:  1.  Percocet 5/325 every 4-6 hours as needed for pain, #30 given, no      refills.  2.  Iron sulfate 325 mg 2-3 times a day.  3.  Colace 100 mg, 1 p.o. q.d.  4.  Prenatal vitamins.   She will followup in the high risk clinic in six weeks for postpartum  visit  and in two days at triage for staple removal and incision inspection.      R_/MEDQ  D:  03/04/2005  T:  03/04/2005  Job:  [id number]

## 2011-03-14 NOTE — Discharge Summary (Signed)
NAME:  Cynthia [name], Cynthia [name]            ACCOUNT NO.:  [id number]   MEDICAL RECORD NO.:  [id number]          PATIENT TYPE:  OBV   LOCATION:  9130                          [facility]   PHYSICIAN:  Tanya S. Shawnie Pons, M.D.   DATE OF BIRTH:  [birth year]   DATE OF ADMISSION:  09/24/2004  DATE OF DISCHARGE:  09/25/2004                                 DISCHARGE SUMMARY   FINAL DIAGNOSES:  1.  Intrauterine pregnancy of [redacted] weeks gestation.  2.  Incompetent cervix.  3.  History of failed rescue cerclage.  4.  History of prior pregnancy loss.  5.  Diabetes.   PERTINENT LABORATORY VALUES:  Hemoglobin 12.5.   PROCEDURE:  Cervical cerclage, McDonald with a stitch at 12 o'clock.   REASON FOR ADMISSION:  Briefly, the patient is a 40 year old black female  with a history of prior pregnancy loss and a failed rescue cerclage with  __________ 31 weeks, who is here for a prophylactic cervical cerclage.   HOSPITAL COURSE:  The patient was admitted on the day of procedure and  underwent it without difficulty.  She was placed on ibuprofen and  antibiotics postoperatively, as well as patient was given a dose of  Delalutin.  The patient tolerated her postoperative course well.  She was  afebrile.  She had no contractions, leakage of fluid, or bleeding.  It was  felt after 24 hours given that she had previously had negative cultures that  she was ready for discharge.   DISCHARGE DISPOSITION AND CONDITION:  The patient was discharged home in  good condition.  Her instructions include resume her diabetic diet, continue  her insulin as directed, ibuprofen 600 mg q.6h. for the next 24 hours.  She  may use over-the-counter brand.  She is on pelvic rest and bed rest with up  to bathroom and shower only.  Follow up will be in the High Risk Clinic on  October 02, 2004.  She will call for an appointment.  All of this was  discussed and explained to the patient.  She understood these.  She does  also understand to  return with any bleeding, leaking fluids, pelvic  pressure, contractions, or cramping.      TSP/MEDQ  D:  09/25/2004  T:  09/25/2004  Job:  [id number]

## 2011-03-14 NOTE — Consult Note (Signed)
NAME:  Cynthia [name], Cynthia [name]                      ACCOUNT NO.:  [id number]   MEDICAL RECORD NO.:  [id number]                   PATIENT TYPE:  INP   LOCATION:  9156                                 [facility]   PHYSICIAN:  Adolph Pollack, M.D.            DATE OF BIRTH:  [birth year]   DATE OF CONSULTATION:  10/15/2002  DATE OF DISCHARGE:                                   CONSULTATION   REASON FOR CONSULTATION:  Epigastric pain.   HISTORY OF PRESENT ILLNESS:  The patient is a 40 year old female who is [redacted]  weeks pregnant.  She has had known history of gallstones; in fact, has an  ultrasound demonstrating a 1.5 cm gallstone back in June 2003.  She did take  a gallbladder cleansing; after that said she thought she saw small flecks  of gallstones.  However, she began having some back pain then epigastric  aching-type pain with nausea and vomiting after any type of oral intake.  She tried antacids and other medications but she just vomited all these.  She did have fairly fatty meals all day long.  She talks about having  intermittent hot and cold spells.  No jaundice.   PAST MEDICAL HISTORY:  1. Gestational diabetes.  2. Gallstones.   PREVIOUS OPERATIONS:  Cerclage, which was done here fairly recently.   ALLERGIES:  AMPICILLIN causes hives.   PREADMISSION MEDICATIONS:  Prenatal vitamins and Colace.   SOCIAL HISTORY:  No smoking or alcohol use.   FAMILY HISTORY:  Positive for hypertension.  No gallbladder disease that she  has known.   REVIEW OF SYSTEMS:  CARDIOVASCULAR:  No known heart disease or hypertension.  PULMONARY:  No asthma or pneumonia.  GI:  No history of peptic ulcer  disease, diverticulitis, or hiatal hernia.  GU:  No known kidney stones.  HEMATOLOGIC:  No known bleeding disorders  or previous transfusions.   PHYSICAL EXAMINATION:  GENERAL:  A well-developed, well-nourished female, no  acute distress.  Pleasant and cooperative.  VITAL SIGNS:  Temperature 100.3  HEENT:  Eyes show extraocular movements are intact and sclerae are clear.  NECK:  Supple without palpable masses.  CARDIOVASCULAR:  Heart demonstrates a regular rate and rhythm with a murmur  heard.  RESPIRATORY:  Breath sounds equal and clear.  Respirations nonlabored.  ABDOMEN:  Soft.  No tenderness noted.  There is a palpable uterus.  No  organomegaly.  Intermittent bowel sounds are heard.   LABORATORY DATA:  Demonstrates liver function tests and lipase within normal  limits.  White count 13,700 with a slight leftward shift.   Abdominal ultrasound done today demonstrates a normal gallbladder with no  evidence of gallbladder stone, no gallbladder wall thickening, and a normal  common bile duct.   IMPRESSION:  Epigastric pain with nausea and vomiting in a patient with  previous known history of gallstones.  She took the gallbladder cleansing;  no gallstone was identified.  Currently,  symptoms have improved since she  has been started on bowel rest as well as IV fluid hydration and IV  cefotetan.  Differential diagnosis could include a viral gastroenteritis  versus a calculus cholecystitis (acute versus chronic), peptic ulcer  disease, or hiatal hernia with reflux.  She currently is also on Prevacid.   PLAN:  As for now, I would continue to treat her expectantly with continued  bowel rest, IV antibiotics, and IV fluid hydration.  Optimally we would  perform a hepatobiliary scan but not in her current pregnant state.  I told  her that we would be in no hurry to consider cholecystectomy as any  operation under general anesthesia could induce premature labor.  She seems  to understand this and is agreeable with the plan.                                               Adolph Pollack, M.D.    Kari Baars  D:  10/15/2002  T:  10/17/2002  Job:  [id number]   cc:   Gerri Spore B. Earlene Plater, M.D.  [address]  [place]  [place] [address]  Fax: [phone]

## 2011-03-14 NOTE — Discharge Summary (Signed)
NAME:  Cynthia [name], Cynthia [name]                      ACCOUNT NO.:  [id number]   MEDICAL RECORD NO.:  [id number]                   PATIENT TYPE:  INP   LOCATION:  9124                                 [facility]   PHYSICIAN:  Conni Elliot, M.D.             DATE OF BIRTH:  [birth year]   DATE OF ADMISSION:  11/23/2002  DATE OF DISCHARGE:  12/06/2002                                 DISCHARGE SUMMARY   ADMISSION DIAGNOSES:  55. A 40 year old G2, P0-0-1-0 at 30 and 0 weeks with spontaneous rupture of     membranes.  2. Preterm labor.  3. History of rescue cerclage at 23 weeks.  4. Gestational diabetes, diet controlled.   DISCHARGE DIAGNOSES:  39. A 40 year old G2, P0-1-1-0 postpartum day two of spontaneous vaginal     delivery.  2. Viable female with Apgars 6 at one minute and 7 at five minutes.  She was     a 31-week delivery and is in the NICU.  3. Gestational diabetes, resolved.   DISCHARGE MEDICATIONS:  1. Ibuprofen 600 mg p.o. q.6h. p.r.n.  2. Prenatal vitamins one p.o. daily.  3. Micronor one p.o. daily as directed.   ADMISSION HISTORY:  The patient is a 40 year old G2, P0-0-1-0 who presented  at 30 weeks with premature rupture of membranes and preterm labor.   HOSPITAL COURSE:  She was placed on gentamicin and clindamycin on admission  due to a penicillin allergy.  She was given betamethasone x2.  An initial  ultrasound showed an AFI of 10.  On repeat on hospital day number three her  AFI was 5.4.  BPP was 6/8.  Throughout the hospital course the fetal heart  tracing remained reassuring.   Gestational diabetes.  The patient was placed on Regular insulin as well as  NPH after receiving betamethasone due to increased blood sugars.  Her sugars  remained stable throughout her stay on that regimen.  Following delivery she  was able to come off the insulin without any elevation in her sugars.   The patient continued to have occasional contractions throughout her  hospital  stay.  On hospital day number 10 she went into spontaneous labor  and delivered a viable female at 31 weeks with Apgars 6 at one minute and 7  at five minutes.  The baby was transferred to the NICU.   The patient had an uneventful postpartum course.  She is deciding to breast-  feed.  For contraception she has been placed on Micronor.   The patient was discharged to home in stable condition.    DISCHARGE INSTRUCTIONS:  The patient was told of her above medical regimen  as well as to follow up at [facility] in six weeks.     Maurice March, M.D.                        Conni Elliot, M.D.  LC/MEDQ  D:  03/15/2003  T:  03/15/2003  Job:  [id number]

## 2011-03-14 NOTE — Discharge Summary (Signed)
NAME:  Cynthia [name], Cynthia [name]                      ACCOUNT NO.:  [id number]   MEDICAL RECORD NO.:  [id number]                   PATIENT TYPE:  INP   LOCATION:  9156                                 [facility]   PHYSICIAN:  Gerri Spore B. Earlene Plater, M.D.               DATE OF BIRTH:  [birth year]   DATE OF ADMISSION:  10/15/2002  DATE OF DISCHARGE:  10/17/2002                                 DISCHARGE SUMMARY   ADMISSION DIAGNOSES:  1. Intrauterine pregnancy at 24 weeks.  2. Epigastric pain.  3. Nausea and vomiting.  4. History of gallstones.   DISCHARGE DIAGNOSES:  1. Intrauterine pregnancy at 24 weeks.  2. Epigastric pain.  3. Nausea and vomiting.  4. History of gallstones.   PROCEDURE:  Intravenous hydration, intravenous antibiotics, and surgical  consultation.   HISTORY OF PRESENT ILLNESS:  A 40 year old African-American female gravida  2, para 0, AB 1 at 24-1/7 weeks admitted with refractory epigastric pain,  nausea and vomiting.  This has been ongoing for about the last 12 hours  prior to admission.  The patient has a history of previously documented  gallstones which had been managed by dietary changes.  Also, pregnancy had  been complicated by emergency cervical cerclage.   It is noted that her abdominal symptoms worsened after eating a lot of  greasy foods while hospitalized for her cerclage.   She was found to have a right upper quadrant tenderness on admission exam.  Cervix was closed with a stable cervical length from previous ultrasound.  Was found to be clinically dehydrated and urinalysis was showing a specific  gravity of greater than 1.030 with greater than 80 ketones.  White count was  fairly elevated around 14,000 with normal liver function tests.   HOSPITAL COURSE:  The patient was admitted for bowel rest, IV hydration, and  antibiotics.  Ultrasound was performed which showed no evidence of acute  cholecystitis or cholelithiasis.  Surgical consultation was  obtained and a  recommendation was for expectant management and continued antibiotics.   By the second hospital day the patient's nausea and vomiting had  dramatically improved and she was able to tolerate a low fat diet.  The  patient was discharged to home on the third hospital day in satisfactory  condition.   DISCHARGE MEDICATIONS:  1. Prevacid 30 mg daily.  2. Keflex 500 mg p.o. q.6 h. x5 days.   FOLLOW UP:  In the office in one week for a recheck and also recommendations  from Dr. Abbey Chatters of surgery would be to obtain a postpartum ultrasound  and if negative to have hepatobiliary scan to further evaluate potential for  gallbladder disease.   DISPOSITION AT DISCHARGE:  Satisfactory.   DISCHARGE INSTRUCTIONS:  Low-fat diet.  Continued bed rest for cervical  insufficiency status post cerclage and preterm labor precautions are given.  Gerri Spore B. Earlene Plater, M.D.    WBD/MEDQ  D:  11/16/2002  T:  11/16/2002  Job:  [id number]

## 2011-03-14 NOTE — Op Note (Signed)
NAME:  Cynthia, [name] NO.:  [id number]   MEDICAL RECORD NO.:  [id number]          PATIENT TYPE:  WOC   LOCATION:  WOC                          FACILITY:  [facility]   PHYSICIAN:  Conni Elliot, M.D.DATE OF BIRTH:  January 21, 1971   DATE OF PROCEDURE:  03/01/2005  DATE OF DISCHARGE:                                 OPERATIVE REPORT   PREOPERATIVE DIAGNOSIS:  Fetal tachycardia, repetitive late fetal heart  decelerations.   POSTOPERATIVE DIAGNOSIS:  Fetal tachycardia, repetitive late fetal heart  decelerations.   PROCEDURE:  Low transverse cesarean section.   SURGEON:  Conni Elliot, M.D.   ANESTHESIA:  Continuous lumbar epidural.   DESCRIPTION OF PROCEDURE:  The patient was taken to the operating room in  the supine left lateral tilt position receiving oxygen.  The abdomen was  prepped and draped in sterile fashion after bringing the continuous lumbar  epidural anesthetic to an operative level.   The incision was made through the skin and subcutaneous fascia.  A low  transverse uterine incision was made.  After developing the bladder flap,  the baby was delivered from vertex presentation.  The cord was doubly  clamped and cut and the baby handed to the neonatologist in attendance.  The  placenta was delivered spontaneously.  The left uterine arteries were cut,  and these were suture ligated individually to get hemostasis in the corner.  The uterine incision, bladder flap, anterior peritoneum, fascia, and skin  were closed in usual fashion.  Estimated blood loss was approximately 1200  cc without replacement.  Needle, sponge, and instrument counts were correct.  The fetus was an 8 pound 10 ounce female with Apgars of 8 and 9.  Cord pH was  7.29.  It should be of note that upon entering the abdominal cavity, there  was found to be a maternal fever which was found to be 101 postoperative.      ASG/MEDQ  D:  03/01/2005  T:  03/02/2005  Job:  [id number]

## 2014-06-20 ENCOUNTER — Other Ambulatory Visit: Payer: Self-pay | Admitting: Family Medicine

## 2014-06-20 DIAGNOSIS — E049 Nontoxic goiter, unspecified: Secondary | ICD-10-CM

## 2014-06-21 ENCOUNTER — Other Ambulatory Visit: Payer: Self-pay

## 2014-08-04 ENCOUNTER — Other Ambulatory Visit: Payer: Self-pay

## 2015-03-16 ENCOUNTER — Other Ambulatory Visit: Payer: Self-pay | Admitting: Physician Assistant

## 2015-03-16 ENCOUNTER — Other Ambulatory Visit (HOSPITAL_COMMUNITY)
Admission: RE | Admit: 2015-03-16 | Discharge: 2015-03-16 | Disposition: A | Discharge status: Home / Self Care | Payer: 59 | Source: Ambulatory Visit | Attending: Physician Assistant | Admitting: Physician Assistant

## 2015-03-16 DIAGNOSIS — Z1151 Encounter for screening for human papillomavirus (HPV): Secondary | ICD-10-CM | POA: Diagnosis present

## 2015-03-16 DIAGNOSIS — Z01419 Encounter for gynecological examination (general) (routine) without abnormal findings: Secondary | ICD-10-CM | POA: Insufficient documentation

## 2015-03-21 LAB — CYTOLOGY - PAP

## 2016-10-02 ENCOUNTER — Other Ambulatory Visit: Payer: Self-pay | Admitting: Family Medicine

## 2016-10-02 DIAGNOSIS — E049 Nontoxic goiter, unspecified: Secondary | ICD-10-CM

## 2016-11-07 ENCOUNTER — Other Ambulatory Visit: Payer: 59

## 2016-11-10 ENCOUNTER — Ambulatory Visit
Admission: RE | Admit: 2016-11-10 | Discharge: 2016-11-10 | Disposition: A | Discharge status: Home / Self Care | Payer: 59 | Source: Ambulatory Visit | Attending: Family Medicine | Admitting: Family Medicine

## 2016-11-10 DIAGNOSIS — E049 Nontoxic goiter, unspecified: Secondary | ICD-10-CM

## 2021-01-04 ENCOUNTER — Other Ambulatory Visit: Payer: Self-pay | Admitting: Physician Assistant

## 2021-01-04 ENCOUNTER — Other Ambulatory Visit (HOSPITAL_COMMUNITY)
Admission: RE | Admit: 2021-01-04 | Discharge: 2021-01-04 | Disposition: A | Discharge status: Home / Self Care | Payer: BC Managed Care – PPO | Source: Ambulatory Visit | Attending: Physician Assistant | Admitting: Physician Assistant

## 2021-01-04 DIAGNOSIS — Z124 Encounter for screening for malignant neoplasm of cervix: Secondary | ICD-10-CM | POA: Diagnosis not present

## 2021-01-08 LAB — CYTOLOGY - PAP
Adequacy: ABSENT
Comment: NEGATIVE
Diagnosis: NEGATIVE
High risk HPV: NEGATIVE
